# Patient Record
Sex: Male | Born: 1965 | Race: White | Hispanic: No | Marital: Married | Smoking: Former smoker
Health system: Southern US, Community
[De-identification: ages and names within clinical notes are randomized; demographics above are authoritative.]

## PROBLEM LIST (undated history)

## (undated) DIAGNOSIS — Z87442 Personal history of urinary calculi: Secondary | ICD-10-CM

## (undated) DIAGNOSIS — N200 Calculus of kidney: Secondary | ICD-10-CM

## (undated) HISTORY — PX: TONSILLECTOMY: SUR1361

## (undated) HISTORY — PX: OTHER SURGICAL HISTORY: SHX169

---

## 2017-04-03 ENCOUNTER — Emergency Department (HOSPITAL_COMMUNITY)
Admission: EM | Admit: 2017-04-03 | Discharge: 2017-04-03 | Disposition: A | Discharge status: Home / Self Care | Payer: 59 | Attending: Emergency Medicine | Admitting: Emergency Medicine

## 2017-04-03 ENCOUNTER — Emergency Department (HOSPITAL_COMMUNITY): Payer: 59

## 2017-04-03 ENCOUNTER — Encounter (HOSPITAL_COMMUNITY): Payer: Self-pay

## 2017-04-03 DIAGNOSIS — R109 Unspecified abdominal pain: Secondary | ICD-10-CM

## 2017-04-03 DIAGNOSIS — N200 Calculus of kidney: Secondary | ICD-10-CM | POA: Insufficient documentation

## 2017-04-03 DIAGNOSIS — Z87891 Personal history of nicotine dependence: Secondary | ICD-10-CM | POA: Insufficient documentation

## 2017-04-03 DIAGNOSIS — R1032 Left lower quadrant pain: Secondary | ICD-10-CM | POA: Diagnosis present

## 2017-04-03 HISTORY — DX: Calculus of kidney: N20.0

## 2017-04-03 LAB — CBC
HEMATOCRIT: 44.6 % (ref 39.0–52.0)
HEMOGLOBIN: 15.4 g/dL (ref 13.0–17.0)
MCH: 29.4 pg (ref 26.0–34.0)
MCHC: 34.5 g/dL (ref 30.0–36.0)
MCV: 85.1 fL (ref 78.0–100.0)
Platelets: 238 10*3/uL (ref 150–400)
RBC: 5.24 MIL/uL (ref 4.22–5.81)
RDW: 13.3 % (ref 11.5–15.5)
WBC: 7 10*3/uL (ref 4.0–10.5)

## 2017-04-03 LAB — BASIC METABOLIC PANEL
Anion gap: 8 (ref 5–15)
BUN: 18 mg/dL (ref 6–20)
CHLORIDE: 106 mmol/L (ref 101–111)
CO2: 24 mmol/L (ref 22–32)
CREATININE: 1.46 mg/dL — AB (ref 0.61–1.24)
Calcium: 9.3 mg/dL (ref 8.9–10.3)
GFR calc Af Amer: >60 mL/min (ref >60)
GFR calc non Af Amer: 54 mL/min — ABNORMAL LOW (ref >60)
Glucose, Bld: 118 mg/dL — ABNORMAL HIGH (ref 65–99)
POTASSIUM: 3.8 mmol/L (ref 3.5–5.1)
Sodium: 138 mmol/L (ref 135–145)

## 2017-04-03 LAB — URINALYSIS, ROUTINE W REFLEX MICROSCOPIC
Bacteria, UA: NONE SEEN
Bilirubin Urine: NEGATIVE
GLUCOSE, UA: NEGATIVE mg/dL
Ketones, ur: 5 mg/dL — AB
Leukocytes, UA: NEGATIVE
NITRITE: NEGATIVE
PH: 5 (ref 5.0–8.0)
Protein, ur: NEGATIVE mg/dL
SPECIFIC GRAVITY, URINE: 1.026 (ref 1.005–1.030)
Squamous Epithelial / LPF: NONE SEEN

## 2017-04-03 MED ORDER — OXYCODONE-ACETAMINOPHEN 5-325 MG PO TABS
1.0000 | ORAL_TABLET | Freq: Once | ORAL | Status: AC
  Administered 2017-04-03: 1 via ORAL

## 2017-04-03 MED ORDER — ONDANSETRON HCL 4 MG PO TABS: 4.0000 mg | ORAL_TABLET | Freq: Four times a day (QID) | ORAL | 0 refills | Status: AC

## 2017-04-03 MED ORDER — HYDROCODONE-ACETAMINOPHEN 5-325 MG PO TABS: 1.0000 | ORAL_TABLET | ORAL | 0 refills | Status: AC | PRN

## 2017-04-03 MED ORDER — OXYCODONE-ACETAMINOPHEN 5-325 MG PO TABS
ORAL_TABLET | ORAL | Status: AC
  Filled 2017-04-03: qty 1

## 2017-04-03 NOTE — ED Notes (Signed)
Patient transported to CT 

## 2017-04-03 NOTE — ED Provider Notes (Signed)
[facility]ENCY DEPT Provider Note   CSN: [id number] Arrival date & time: 04/03/17  1637     History   Chief Complaint Chief Complaint  Patient presents with  . Flank Pain    HPI Jeffery Powers is a 51 y.o. male.  51 year old male with Hx of kidney stones last 14 years ago has been able to pass 2 needed intervention for the 3rd.  Today developed L flank pain at 1 PM that gradually decreased than returned at 4 PM with nausea  Has not taken any medication for nausea of pain relief      Past Medical History:  Diagnosis Date  . Kidney stones     There are no active problems to display for this patient.   Past Surgical History:  Procedure Laterality Date  . Kidney Stone Removal         Home Medications    Prior to Admission medications   Medication Sig Start Date End Date Taking? Authorizing Provider  HYDROcodone-acetaminophen (NORCO/VICODIN) 5-325 MG tablet Take 1 tablet by mouth every 4 (four) hours as needed. 04/03/17   Earley Favor, NP  ondansetron (ZOFRAN) 4 MG tablet Take 1 tablet (4 mg total) by mouth every 6 (six) hours. 04/03/17   Earley Favor, NP    Family History No family history on file.  Social History Social History  Substance Use Topics  . Smoking status: Former Games developer  . Smokeless tobacco: Never Used  . Alcohol use Not on file     Allergies   Patient has no known allergies.   Review of Systems Review of Systems  Constitutional: Negative for fever.  Gastrointestinal: Positive for nausea. Negative for vomiting.  Genitourinary: Positive for flank pain. Negative for dysuria and hematuria.  All other systems reviewed and are negative.    Physical Exam Updated Vital Signs BP 138/87   Pulse 82   Temp 97.8 F (36.6 C) (Oral)   Resp 16   Ht 5\' 11"  (1.803 m)   Wt 99.8 kg (220 lb)   SpO2 99%   BMI 30.68 kg/m   Physical Exam  Constitutional: He appears well-developed and well-nourished.  HENT:  Head: Normocephalic.  Eyes: Pupils are  equal, round, and reactive to light.  Neck: Normal range of motion.  Cardiovascular: Normal rate.   Pulmonary/Chest: Effort normal.  Musculoskeletal: Normal range of motion.  Neurological: He is alert.  Skin: Skin is warm.  Nursing note and vitals reviewed.    ED Treatments / Results  Labs (all labs ordered are listed, but only abnormal results are displayed) Labs Reviewed  URINALYSIS, ROUTINE W REFLEX MICROSCOPIC - Abnormal; Notable for the following:       Result Value   APPearance HAZY (*)    Hgb urine dipstick LARGE (*)    Ketones, ur 5 (*)    All other components within normal limits  BASIC METABOLIC PANEL - Abnormal; Notable for the following:    Glucose, Bld 118 (*)    Creatinine, Ser 1.46 (*)    GFR calc non Af Amer 54 (*)    All other components within normal limits  CBC    EKG  EKG Interpretation None       Radiology Ct Renal Stone Study  Result Date: 04/03/2017 CLINICAL DATA:  LEFT flank pain beginning this afternoon. History of kidney stones. EXAM: CT ABDOMEN AND PELVIS WITHOUT CONTRAST TECHNIQUE: Multidetector CT imaging of the abdomen and pelvis was performed following the standard protocol without IV contrast. COMPARISON:  None. FINDINGS: LOWER CHEST: Minimal dependent atelectasis. The visualized heart size is normal. No pericardial effusion. HEPATOBILIARY: Diffusely mildly hypodense liver compatible with steatosis. Normal gallbladder. PANCREAS: Normal. SPLEEN: Normal. ADRENALS/URINARY TRACT: Kidneys are orthotopic, demonstrating normal size and morphology. Mild LEFT hydronephrosis, 5 mm proximal ureteral calculus. 4 residual LEFT nephrolithiasis measuring to 4 mm LEFT upper pole. No RIGHT nephrolithiasis or hydronephrosis. Limited assessment for renal masses on this nonenhanced examination. The unopacified ureters are normal in course and caliber. Urinary bladder is partially distended and unremarkable. Normal adrenal glands. STOMACH/BOWEL: The stomach, small  and large bowel are normal in course and caliber without inflammatory changes, sensitivity decreased by lack of enteric contrast. Normal appendix. VASCULAR/LYMPHATIC: Aortoiliac vessels are normal in course and caliber, trace calcific atherosclerosis. No lymphadenopathy by CT size criteria. REPRODUCTIVE: Prostate size is relatively normal, however indents the base the bladder. OTHER: No intraperitoneal free fluid or free air. MUSCULOSKELETAL: Non-acute. Small bilateral fat containing inguinal hernias. Small fat containing umbilical hernia. Scattered old Schmorl's nodes. IMPRESSION: 1. 5 mm proximal LEFT ureteral calculus resulting in mild LEFT obstructive uropathy. 2. 4 LEFT nephrolithiasis measuring to 4 mm. Electronically Signed   By: Awilda Metroourtnay  Bloomer M.D.   On: 04/03/2017 21:27    Procedures Procedures (including critical care time)  Medications Ordered in ED Medications  oxyCODONE-acetaminophen (PERCOCET/ROXICET) 5-325 MG per tablet (not administered)  oxyCODONE-acetaminophen (PERCOCET/ROXICET) 5-325 MG per tablet 1 tablet (1 tablet Oral Given 04/03/17 1650)     Initial Impression / Assessment and Plan / ED Course  I have reviewed the triage vital signs and the nursing notes.  Pertinent labs & imaging results that were available during my care of the patient were reviewed by me and considered in my medical decision making (see chart for details).     Pain free at time of my examination will obtain renal stone study  Patient is a 5 mm stone which should pass on its M he also has 4 more small stones within the renal pole has been discussed with the patient has been given a prescription for Vicodin as well as Zofran that he can use at home for any episodes of pain as well as a referral to urology for follow-up Final Clinical Impressions(s) / ED Diagnoses   Final diagnoses:  Left flank pain  Kidney stone    New Prescriptions New Prescriptions   HYDROCODONE-ACETAMINOPHEN (NORCO/VICODIN)  5-325 MG TABLET    Take 1 tablet by mouth every 4 (four) hours as needed.   ONDANSETRON (ZOFRAN) 4 MG TABLET    Take 1 tablet (4 mg total) by mouth every 6 (six) hours.     Earley FavorSchulz, Keiasha Diep, NP 04/03/17 2227    Doug SouJacubowitz, Sam, MD 04/03/17 [phone]

## 2017-04-03 NOTE — ED Triage Notes (Signed)
Per Pt, Pt is coming from home with complaints of left-sided flank pain that started at 1300. Pt reports having hx of four stones. Denies N/V/D or hematuria.

## 2017-04-03 NOTE — Discharge Instructions (Signed)
You have a 5mm stone that should pass on it own  You have been given a referral to Urology for follow up You have been give prescriptions for pain and nausea control if needed

## 2017-07-30 ENCOUNTER — Other Ambulatory Visit: Payer: Self-pay | Admitting: Urology

## 2017-07-31 ENCOUNTER — Encounter (HOSPITAL_COMMUNITY): Payer: Self-pay | Admitting: *Deleted

## 2017-07-31 ENCOUNTER — Other Ambulatory Visit: Payer: Self-pay

## 2017-08-01 NOTE — H&P (Signed)
 @media  Print    Office Visit Report 07/29/2017    Jeffery HickJeryl Payes         MRN: [id number]  PRIMARY CARE:    DOB: [birth year], 51 year old Male  REFERRING:    SSN: [id number]  PROVIDER:  Rutherford NailEugene Bell, III, M.D.    LOCATION:  [facility] [phone]    CC: I have kidney stones.  HPI: Jeffery Powers is a 51 year-old male patient who is here for renal calculi.  The problem is on the left side. He first stated noticing pain on approximately 07/22/2017. This is not his first kidney stone. He has had more than 5 stones prior to getting this one. He is currently having flank pain and back pain. He denies having groin pain, nausea, vomiting, fever, and chills. He has not caught a stone in his urine strainer since his symptoms began.   He has had ureteral stent and ureteroscopy for treatment of his stones in the past.   The patient has been having severe left-sided flank pain control with hydrocodone. He has a history of nephrolithiasis and has passed several stones in the past. KUB today showed a 6-7 mm left proximal ureteral calculus. He denies fevers, chills, nausea, vomiting. His last stone was in July at which point he had a CT scan revealing multiple left-sided renal calculi as well as a 7 mm proximal ureteral calculus.     ALLERGIES: None   MEDICATIONS: Ibuprofen PRN     GU PSH: Ureteroscopic stone removal    NON-GU PSH: None       GU PMH: Renal calculus    NON-GU PMH: None   FAMILY HISTORY: Death of family member - Father Kidney Stones - Father Prostate Cancer - Father    Notes: 1 son   SOCIAL HISTORY: Marital Status: Married Preferred Language: English; Ethnicity: Not Hispanic Or Latino; Race: White Current Smoking Status: Patient does not smoke anymore. Has not smoked since 07/24/1997. Smoked for 18 years. Smoked 1 pack per day.  <DIV'  Tobacco Use Assessment Completed:  Used Tobacco in last 30 days?   Drinks 1 drink per day.  Drinks 1 caffeinated drink per day.   REVIEW OF SYSTEMS:    GU Review Male:  Patient reports stream starts and stops, frequent urination, and get up at night to urinate. Patient denies leakage of urine, penile pain, trouble starting your stream, erection problems, burning/ pain with urination, have to strain to urinate , and hard to postpone urination.   Gastrointestinal (Upper):  Patient reports nausea, vomiting, and indigestion/ heartburn.    Gastrointestinal (Lower):  Patient denies diarrhea and constipation.   Constitutional:  Patient denies fever, night sweats, weight loss, and fatigue.   Skin:  Patient denies skin rash/ lesion and itching.   Eyes:  Patient denies blurred vision and double vision.   Ears/ Nose/ Throat:  Patient denies sore throat and sinus problems.   Hematologic/Lymphatic:  Patient denies swollen glands and easy bruising.   Cardiovascular:  Patient denies leg swelling and chest pains.   Respiratory:  Patient reports cough. Patient denies shortness of breath.   Endocrine:  Patient denies excessive thirst.   Musculoskeletal:  Patient reports back pain. Patient denies joint pain.   Neurological:  Patient reports headaches. Patient denies dizziness.   Psychologic:  Patient denies depression and anxiety.   VITAL SIGNS:      07/29/2017 03:27 PM    Weight 220 lb / 99.79 kg  Height 71 in / 180.34 cm    BP 129/82 mmHg    Pulse 89 /min    Temperature 97.5 F / 36.3 C    BMI 30.7 kg/m    MULTI-SYSTEM PHYSICAL EXAMINATION:     Constitutional: Well-nourished. No physical deformities. Normally developed. Good grooming.    Respiratory: No labored breathing, no use of accessory muscles.     Cardiovascular: Normal temperature, adequate perfusion    Skin: No paleness, no jaundice    Neurologic / Psychiatric: Oriented to time, oriented to place, oriented to person. No depression, no anxiety, no agitation.    Gastrointestinal: No mass, no tenderness, no rigidity, non obese abdomen. No CVA tenderness bilaterally      Eyes: Normal conjunctivae. Normal eyelids.    Musculoskeletal: Normal gait and station of head and neck.          PAST DATA REVIEWED:   Source Of History:  Patient   PROCEDURES:    KUB - [id number]  A single view of the abdomen is obtained.          Urinalysis  Dipstick Dipstick Cont'd  Color: Yellow Bilirubin: Neg  Appearance: Clear Ketones: Neg  Specific Gravity: 1.025 Blood: Neg  pH: 6.0 Protein: Trace  Glucose: Neg Urobilinogen: 0.2   Nitrites: Neg   Leukocyte Esterase: Neg    ASSESSMENT:     ICD-10 Details  1 GU:  Renal calculus - N20.0    PLAN:   Medications  New Meds: Tamsulosin Hcl 0.4 mg capsule, ext release 24 hr 1 capsule PO Daily To assist with stone passage #14 1 Refill(s)  Hydrocodone-Acetaminophen 5 mg-325 mg tablet 1 tablet PO Q 4 H PRN For pain #15 0 Refill(s)    Orders  X-Rays: KUB  Schedule    Document  Letter(s):  Created for Patient: Clinical Summary   Notes:  We discussed the management of urinary stones. These options include observation, ureteroscopy, and shockwave lithotripsy. We discussed which options are relevant to these particular stones. We discussed the natural history of stones as well as the complications of untreated stones and the impact on quality of life without treatment as well as with each of the above listed treatments. We also discussed the efficacy of each treatment in its ability to clear the stone burden. With any of these management options I discussed the signs and symptoms of infection and the need for emergent treatment should these be experienced. For each option we discussed the ability of each procedure to clear the patient of their stone burden.   For observation I described the risks which include but are not limited to silent renal damage, life-threatening infection, need for emergent surgery, failure to pass stone, and pain.   For ureteroscopy I described the risks which include heart attack, stroke, pulmonary  embolus, death, bleeding, infection, damage to contiguous structures, positioning injury, ureteral stricture, ureteral avulsion, ureteral injury, need for ureteral stent, inability to perform ureteroscopy, need for an interval procedure, inability to clear stone burden, stent discomfort and pain.   For shockwave lithotripsy I described the risks which include arrhythmia, kidney contusion, kidney hemorrhage, need for transfusion, pain, inability to break up stone, inability to pass stone fragments, Steinstrasse, infection associated with obstructing stones, need for different surgical procedure, need for repeat shockwave lithotripsy, and death.   He would like to proceed with left ESWL. I gave him a prescription for hydrocodone today and also give him a strainer to strain his urine as well as a prescription  for Flomax. If he passes the stone in the meantime he will call us.   * Signed by Modena Slater, III, M.D. on 07/29/17 at 4:40 PM (EST)*

## 2017-08-04 ENCOUNTER — Encounter (HOSPITAL_COMMUNITY): Payer: Self-pay | Admitting: *Deleted

## 2017-08-04 ENCOUNTER — Other Ambulatory Visit: Payer: Self-pay

## 2017-08-04 ENCOUNTER — Encounter (HOSPITAL_COMMUNITY)
Admission: RE | Disposition: A | Discharge status: Home / Self Care | Payer: Self-pay | Source: Ambulatory Visit | Attending: Urology

## 2017-08-04 ENCOUNTER — Ambulatory Visit (HOSPITAL_COMMUNITY): Payer: 59

## 2017-08-04 ENCOUNTER — Ambulatory Visit (HOSPITAL_COMMUNITY)
Admission: RE | Admit: 2017-08-04 | Discharge: 2017-08-04 | Disposition: A | Discharge status: Home / Self Care | Payer: 59 | Source: Ambulatory Visit | Attending: Urology | Admitting: Urology

## 2017-08-04 DIAGNOSIS — Z87442 Personal history of urinary calculi: Secondary | ICD-10-CM | POA: Insufficient documentation

## 2017-08-04 DIAGNOSIS — N201 Calculus of ureter: Secondary | ICD-10-CM

## 2017-08-04 DIAGNOSIS — N202 Calculus of kidney with calculus of ureter: Secondary | ICD-10-CM | POA: Insufficient documentation

## 2017-08-04 DIAGNOSIS — Z87891 Personal history of nicotine dependence: Secondary | ICD-10-CM | POA: Diagnosis not present

## 2017-08-04 HISTORY — PX: EXTRACORPOREAL SHOCK WAVE LITHOTRIPSY: SHX1557

## 2017-08-04 HISTORY — DX: Personal history of urinary calculi: Z87.442

## 2017-08-04 SURGERY — LITHOTRIPSY, ESWL
Anesthesia: LOCAL | Laterality: Left

## 2017-08-04 MED ORDER — HYDROCODONE-ACETAMINOPHEN 5-325 MG PO TABS
1.0000 | ORAL_TABLET | Freq: Once | ORAL | Status: AC
  Administered 2017-08-04: 1 via ORAL
  Filled 2017-08-04: qty 1

## 2017-08-04 MED ORDER — CIPROFLOXACIN HCL 500 MG PO TABS
500.0000 mg | ORAL_TABLET | ORAL | Status: AC
  Administered 2017-08-04: 500 mg via ORAL
  Filled 2017-08-04: qty 1

## 2017-08-04 MED ORDER — DIAZEPAM 5 MG PO TABS
10.0000 mg | ORAL_TABLET | ORAL | Status: AC
  Administered 2017-08-04: 10 mg via ORAL
  Filled 2017-08-04: qty 2

## 2017-08-04 MED ORDER — DIPHENHYDRAMINE HCL 25 MG PO CAPS
25.0000 mg | ORAL_CAPSULE | ORAL | Status: AC
  Administered 2017-08-04: 25 mg via ORAL
  Filled 2017-08-04: qty 1

## 2017-08-04 MED ORDER — SODIUM CHLORIDE 0.9 % IV SOLN
INTRAVENOUS | Status: DC
  Administered 2017-08-04: 07:00:00 via INTRAVENOUS

## 2017-08-04 NOTE — Discharge Instructions (Signed)
Moderate Conscious Sedation, Adult, Care After °These instructions provide you with information about caring for yourself after your procedure. Your health care provider may also give you more specific instructions. Your treatment has been planned according to current medical practices, but problems sometimes occur. Call your health care provider if you have any problems or questions after your procedure. °What can I expect after the procedure? °After your procedure, it is common: °· To feel sleepy for several hours. °· To feel clumsy and have poor balance for several hours. °· To have poor judgment for several hours. °· To vomit if you eat too soon. ° °Follow these instructions at home: °For at least 24 hours after the procedure: ° °· Do not: °? Participate in activities where you could fall or become injured. °? Drive. °? Use heavy machinery. °? Drink alcohol. °? Take sleeping pills or medicines that cause drowsiness. °? Make important decisions or sign legal documents. °? Take care of children on your own. °· Rest. °Eating and drinking °· Follow the diet recommended by your health care provider. °· If you vomit: °? Drink water, juice, or soup when you can drink without vomiting. °? Make sure you have little or no nausea before eating solid foods. °General instructions °· Have a responsible adult stay with you until you are awake and alert. °· Take over-the-counter and prescription medicines only as told by your health care provider. °· If you smoke, do not smoke without supervision. °· Keep all follow-up visits as told by your health care provider. This is important. °Contact a health care provider if: °· You keep feeling nauseous or you keep vomiting. °· You feel light-headed. °· You develop a rash. °· You have a fever. °Get help right away if: °· You have trouble breathing. °This information is not intended to replace advice given to you by your health care provider. Make sure you discuss any questions you have  with your health care provider. °Document Released: 06/30/2013 Document Revised: 02/12/2016 Document Reviewed: 12/30/2015 °Elsevier Interactive Patient Education © 2018 [employer] °Lithotripsy, Care After °This sheet gives you information about how to care for yourself after your procedure. Your health care provider may also give you more specific instructions. If you have problems or questions, contact your health care provider. °What can I expect after the procedure? °After the procedure, it is common to have: °· Some blood in your urine. This should only last for a few days. °· Soreness in your back, sides, or upper abdomen for a few days. °· Blotches or bruises on your back where the pressure wave entered the skin. °· Pain, discomfort, or nausea when pieces (fragments) of the kidney stone move through the tube that carries urine from the kidney to the bladder (ureter). Stone fragments may pass soon after the procedure, but they may continue to pass for up to 4-8 weeks. °? If you have severe pain or nausea, contact your health care provider. This may be caused by a large stone that was not broken up, and this may mean that you need more treatment. °· Some pain or discomfort during urination. °· Some pain or discomfort in the lower abdomen or (in men) at the base of the penis. ° °Follow these instructions at home: °Medicines °· Take over-the-counter and prescription medicines only as told by your health care provider. °· If you were prescribed an antibiotic medicine, take it as told by your health care provider. Do not stop taking the antibiotic even if you   start to feel better.  Do not drive for 24 hours if you were given a medicine to help you relax (sedative).  Do not drive or use heavy machinery while taking prescription pain medicine. Eating and drinking  Drink enough water and fluids to keep your urine clear or pale yellow. This helps any remaining pieces of the stone to pass. It can also help  prevent new stones from forming.  Eat plenty of fresh fruits and vegetables.  Follow instructions from your health care provider about eating and drinking restrictions. You may be instructed: ? To reduce how much salt (sodium) you eat or drink. Check ingredients and nutrition facts on packaged foods and beverages. ? To reduce how much meat you eat.  Eat the recommended amount of calcium for your age and gender. Ask your health care provider how much calcium you should have. General instructions  Get plenty of rest.  Most people can resume normal activities 1-2 days after the procedure. Ask your health care provider what activities are safe for you.  If directed, strain all urine through the strainer that was provided by your health care provider. ? Keep all fragments for your health care provider to see. Any stones that are found may be sent to a medical lab for examination. The stone may be as small as a grain of salt.  Keep all follow-up visits as told by your health care provider. This is important. Contact a health care provider if:  You have pain that is severe or does not get better with medicine.  You have nausea that is severe or does not go away.  You have blood in your urine longer than your health care provider told you to expect.  You have more blood in your urine.  You have pain during urination that does not go away.  You urinate more frequently than usual and this does not go away.  You develop a rash or any other possible signs of an allergic reaction. Get help right away if:  You have severe pain in your back, sides, or upper abdomen.  You have severe pain while urinating.  Your urine is very dark red.  You have blood in your stool (feces).  You cannot pass any urine at all.  You feel a strong urge to urinate after emptying your bladder.  You have a fever or chills.  You develop shortness of breath, difficulty breathing, or chest pain.  You have  severe nausea that leads to persistent vomiting.  You faint. Summary  After this procedure, it is common to have some pain, discomfort, or nausea when pieces (fragments) of the kidney stone move through the tube that carries urine from the kidney to the bladder (ureter). If this pain or nausea is severe, however, you should contact your health care provider.  Most people can resume normal activities 1-2 days after the procedure. Ask your health care provider what activities are safe for you.  Drink enough water and fluids to keep your urine clear or pale yellow. This helps any remaining pieces of the stone to pass, and it can help prevent new stones from forming.  If directed, strain your urine and keep all fragments for your health care provider to see. Fragments or stones may be as small as a grain of salt.  Get help right away if you have severe pain in your back, sides, or upper abdomen or have severe pain while urinating. This information is not intended to replace advice  given to you by your health care provider. Make sure you discuss any questions you have with your health care provider. Document Released: 09/29/2007 Document Revised: 07/31/2016 Document Reviewed: 07/31/2016 Elsevier Interactive Patient Education  2017 Elsevier Inc.    Next Prescription Pain Medication can be given at approx 1:30 pm  1. You should strain your urine and collect all fragments and bring them to your follow up appointment.  2. You should take your pain medication as needed.  Please call if your pain is severe to the point that it is not controlled with your pain medication. 3. You should call if you develop fever > 101 or persistent nausea or vomiting. 4. Your doctor may prescribe tamsulosin to take to help facilitate stone passage.

## 2017-08-04 NOTE — Interval H&P Note (Signed)
History and Physical Interval Note:  08/04/2017 7:30 AM  Jeffery Powers  has presented today for surgery, with the diagnosis of LEFT URETERAL STONE  The various methods of treatment have been discussed with the patient and family. After consideration of risks, benefits and other options for treatment, the patient has consented to  Procedure(s): LEFT EXTRACORPOREAL SHOCK WAVE LITHOTRIPSY (ESWL) (Left) as a surgical intervention .  The patient's history has been reviewed, patient examined, no change in status, stable for surgery.  I have reviewed the patient's chart and labs.  Questions were answered to the patient's satisfaction.     Arslan Kier,LES

## 2017-08-04 NOTE — Op Note (Signed)
Please see scanned chart for operative ESWL note.

## 2018-07-11 IMAGING — CT CT RENAL STONE PROTOCOL
2 of 4 series · 16 of 46 positions shown, 18 images · non-contrast
Comparison: None.

CLINICAL DATA: LEFT flank pain beginning this afternoon. History of
kidney stones.

EXAM:
CT ABDOMEN AND PELVIS WITHOUT CONTRAST
TECHNIQUE: Multidetector CT imaging of the abdomen and pelvis was performed
following the standard protocol without IV contrast.

[Series 3: stone study 5.0 i30f 2 · axial · 0.82mm/px · z∈[+759,+1219]mm · 13 of 100 slices shown, 15 images]
[im 4/100  soft-tissue]
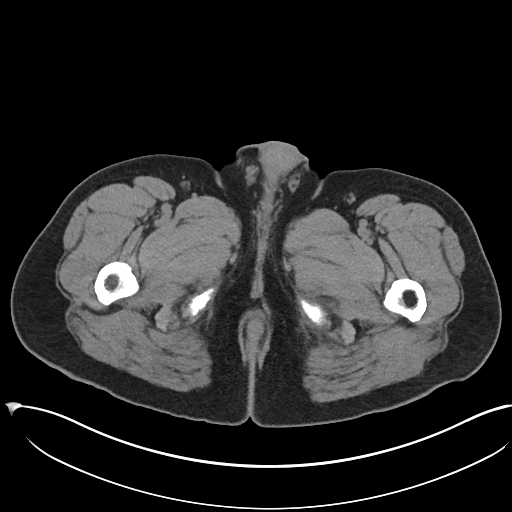
[im 4/100  bone]
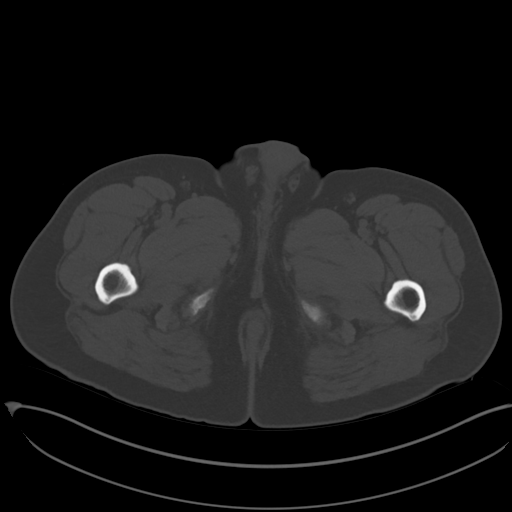
[im 12/100  soft-tissue]
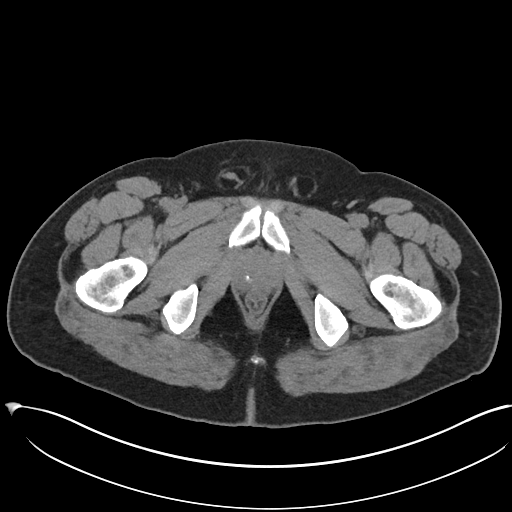
[im 20/100  soft-tissue]
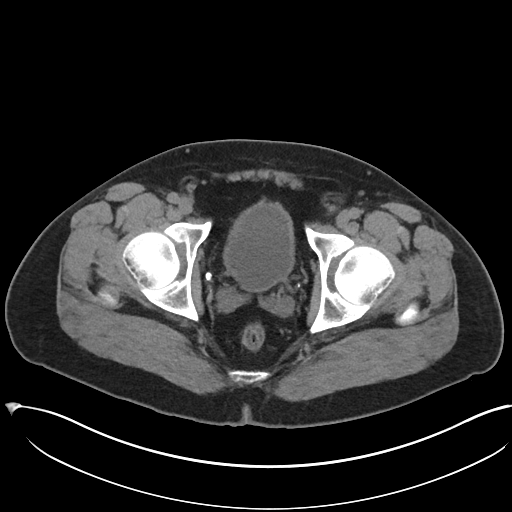
[im 28/100  soft-tissue]
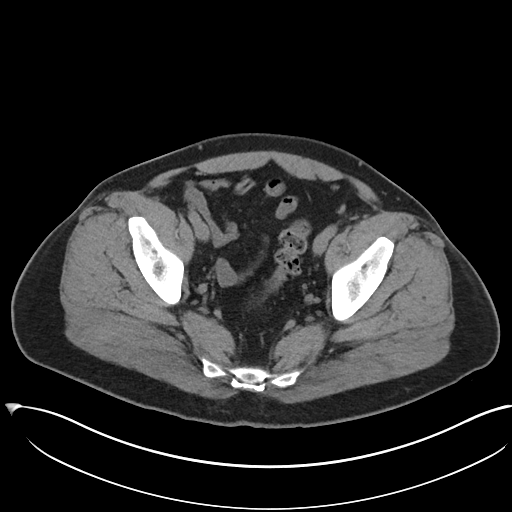
[im 36/100  soft-tissue]
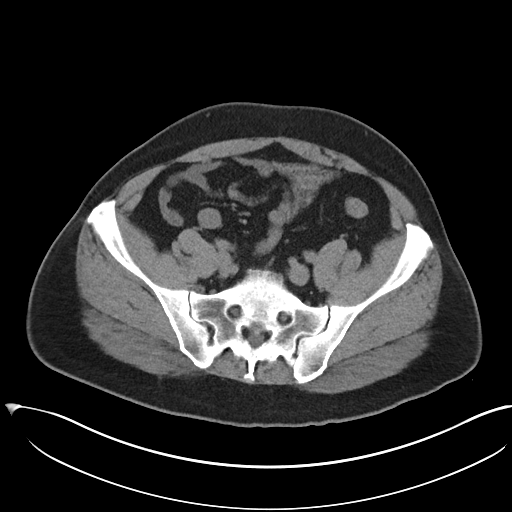
[im 44/100  soft-tissue]
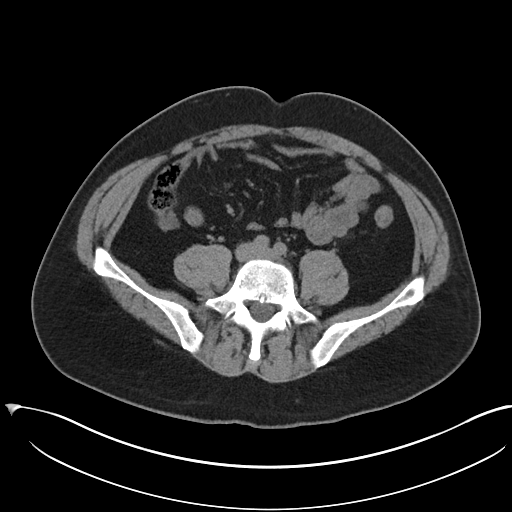
[im 52/100  soft-tissue]
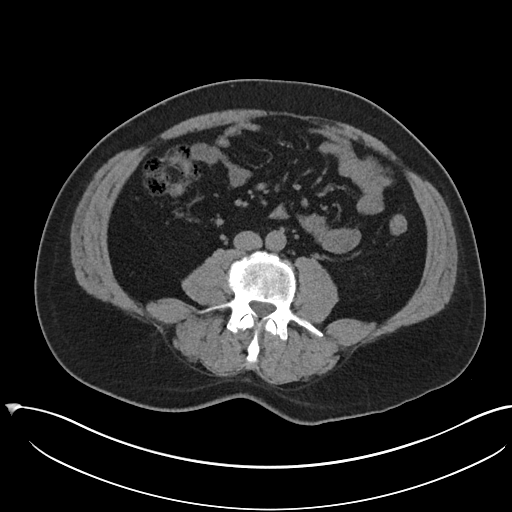
[im 56/100  soft-tissue]
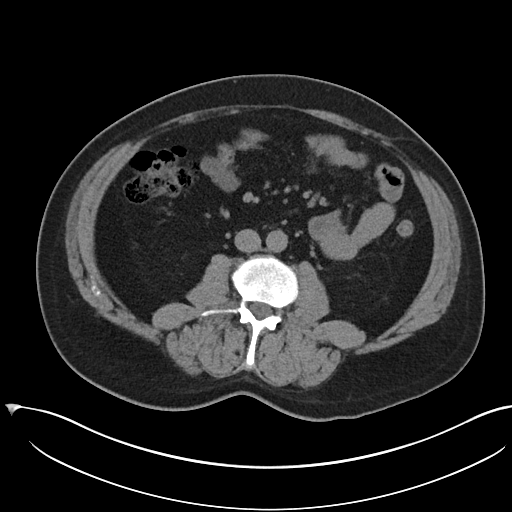
[im 64/100  soft-tissue]
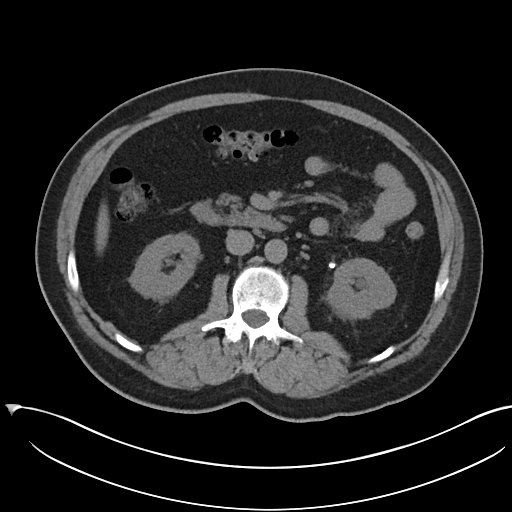
[im 64/100  bone]
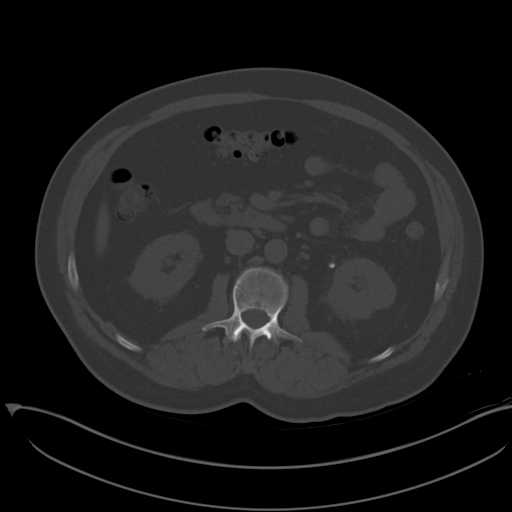
[im 72/100  soft-tissue]
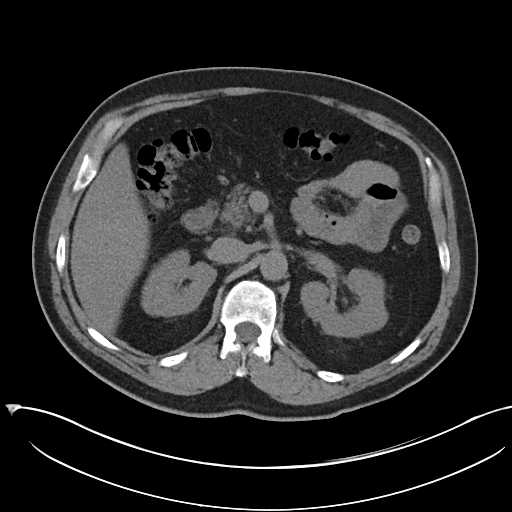
[im 80/100  soft-tissue]
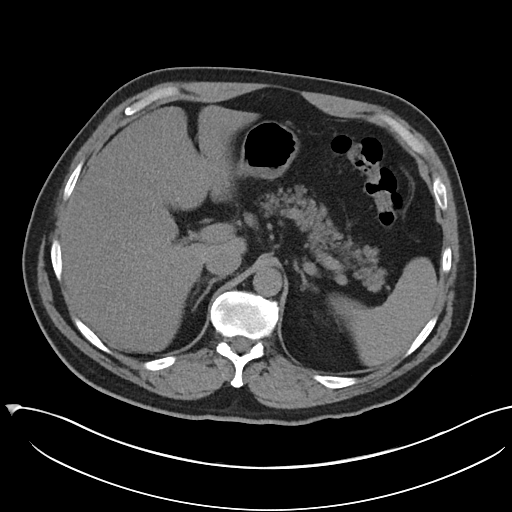
[im 88/100  soft-tissue]
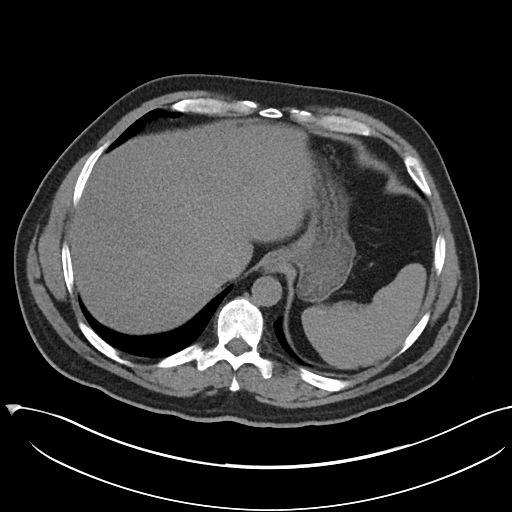
[im 96/100  soft-tissue]
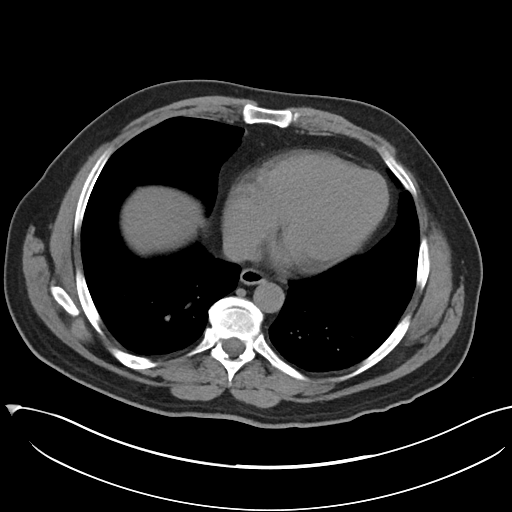

[Series 6: coronal soft tissue · coronal · 0.83mm/px · 3 of 106 slices shown]
[im 36/106  soft-tissue]
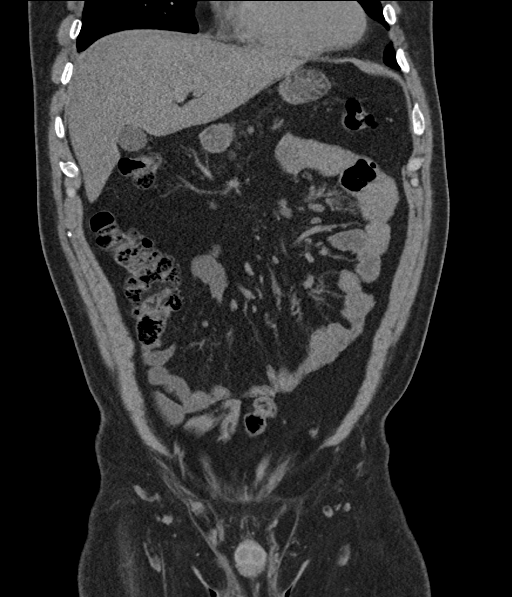
[im 47/106  soft-tissue]
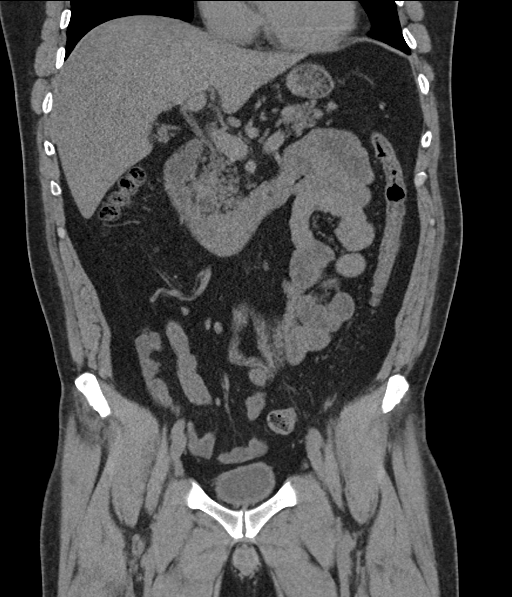
[im 59/106  soft-tissue]
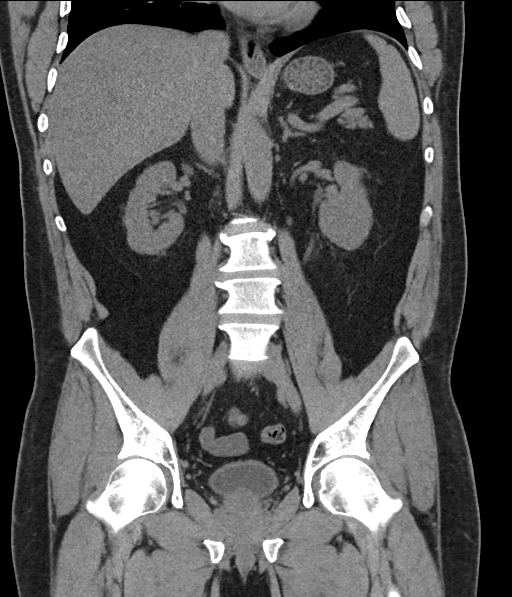

[16 of 46 positions shown; findings below may reference images not displayed]

FINDINGS: LOWER CHEST: Minimal dependent atelectasis. The visualized heart
size is normal. No pericardial effusion.

HEPATOBILIARY: Diffusely mildly hypodense liver compatible with
steatosis. Normal gallbladder.

PANCREAS: Normal.

SPLEEN: Normal.

ADRENALS/URINARY TRACT: Kidneys are orthotopic, demonstrating normal
size and morphology. Mild LEFT hydronephrosis, 5 mm proximal
ureteral calculus. 4 residual LEFT nephrolithiasis measuring to 4 mm
LEFT upper pole. No RIGHT nephrolithiasis or hydronephrosis. Limited
assessment for renal masses on this nonenhanced examination. The
unopacified ureters are normal in course and caliber. Urinary
bladder is partially distended and unremarkable. Normal adrenal
glands.

STOMACH/BOWEL: The stomach, small and large bowel are normal in
course and caliber without inflammatory changes, sensitivity
decreased by lack of enteric contrast. Normal appendix.

VASCULAR/LYMPHATIC: Aortoiliac vessels are normal in course and
caliber, trace calcific atherosclerosis. No lymphadenopathy by CT
size criteria.

REPRODUCTIVE: Prostate size is relatively normal, however indents
the base the bladder.

OTHER: No intraperitoneal free fluid or free air.

MUSCULOSKELETAL: Non-acute. Small bilateral fat containing inguinal
hernias. Small fat containing umbilical hernia. Scattered old
Schmorl's nodes.
IMPRESSION: 1. 5 mm proximal LEFT ureteral calculus resulting in mild LEFT
obstructive uropathy.
2. 4 LEFT nephrolithiasis measuring to 4 mm.

## 2022-07-24 ENCOUNTER — Ambulatory Visit
Admission: EM | Admit: 2022-07-24 | Discharge: 2022-07-24 | Disposition: A | Discharge status: Home / Self Care | Payer: BC Managed Care – PPO | Attending: Urgent Care | Admitting: Urgent Care

## 2022-07-24 DIAGNOSIS — L0233 Carbuncle of buttock: Secondary | ICD-10-CM

## 2022-07-24 MED ORDER — SULFAMETHOXAZOLE-TRIMETHOPRIM 800-160 MG PO TABS: 1.0000 | ORAL_TABLET | Freq: Two times a day (BID) | ORAL | 0 refills | Status: AC

## 2022-07-24 NOTE — ED Triage Notes (Signed)
Pt. Presents to UC w/ c/o bumps on his right buttock that appeared a week ago. Pt. States the bumps are painful and produce clear drainage.

## 2022-07-24 NOTE — ED Provider Notes (Signed)
[facility]    CSN: [id number] Arrival date & time: 07/24/22  1053      History   Chief Complaint Chief Complaint  Patient presents with   Rash    HPI Jeffery Powers is a 56 y.o. male.    Rash   This to UC with complaint of "bumps" on his right buttock appearing 1 week ago.  He states the lesions are painful and producing clear drainage.  Past Medical History:  Diagnosis Date   History of kidney stones    Kidney stones     There are no problems to display for this patient.   Past Surgical History:  Procedure Laterality Date   EXTRACORPOREAL SHOCK WAVE LITHOTRIPSY Left 08/04/2017   Procedure: LEFT EXTRACORPOREAL SHOCK WAVE LITHOTRIPSY (ESWL);  Surgeon: Raynelle Bring, MD;  Location: [facility];  Service: Urology;  Laterality: Left;   Kidney Stone Removal     TONSILLECTOMY         Home Medications    Prior to Admission medications   Medication Sig Start Date End Date Taking? Authorizing Provider  calcium carbonate (TUMS - DOSED IN MG ELEMENTAL CALCIUM) 500 MG chewable tablet Chew 2 tablets daily by mouth.    [provider]  HYDROcodone-acetaminophen (NORCO/VICODIN) 5-325 MG tablet Take 1 tablet by mouth every 4 (four) hours as needed. 04/03/17   Junius Creamer, NP  ondansetron (ZOFRAN) 4 MG tablet Take 1 tablet (4 mg total) by mouth every 6 (six) hours. 04/03/17   Junius Creamer, NP  Pseudoephedrine-Ibuprofen 30-200 MG TABS Take 1 tablet every 8 (eight) hours as needed by mouth.    [provider]    Family History No family history on file.  Social History Social History   Tobacco Use   Smoking status: Former    Packs/day: 0.25    Years: 18.00    Total pack years: 4.50    Types: Cigarettes    Quit date: 09/23/2006    Years since quitting: 15.8   Smokeless tobacco: Never  Vaping Use   Vaping Use: Never used  Substance Use Topics   Alcohol use: Yes    Comment: has not had any alcohol in approx 3 wks   Drug use: No      Allergies   Patient has no known allergies.   Review of Systems Review of Systems  Skin:  Positive for rash.     Physical Exam Triage Vital Signs ED Triage Vitals  Enc Vitals Group     BP      Pulse      Resp      Temp      Temp src      SpO2      Weight      Height      Head Circumference      Peak Flow      Pain Score      Pain Loc      Pain Edu?      Excl. in [place]?    No data found.  Updated Vital Signs There were no vitals taken for this visit.  Visual Acuity Right Eye Distance:   Left Eye Distance:   Bilateral Distance:    Right Eye Near:   Left Eye Near:    Bilateral Near:     Physical Exam Constitutional:      Appearance: Normal appearance.  Musculoskeletal:       Legs:  Neurological:     Mental Status: He is alert.  UC Treatments / Results  Labs (all labs ordered are listed, but only abnormal results are displayed) Labs Reviewed - No data to display  EKG   Radiology No results found.  Procedures Procedures (including critical care time)  Medications Ordered in UC Medications - No data to display  Initial Impression / Assessment and Plan / UC Course  I have reviewed the triage vital signs and the nursing notes.  Pertinent labs & imaging results that were available during my care of the patient were reviewed by me and considered in my medical decision making (see chart for details).   Cellulitic likely abscesses.  Not fluctuant and no clear pocket of purulence to drain via I&D.  Will treat with antibiotics and recommend patient perform warm water soaks with dissolved Epsom salts twice daily.  Final Clinical Impressions(s) / UC Diagnoses   Final diagnoses:  None   Discharge Instructions   None    ED Prescriptions   None    PDMP not reviewed this encounter.   Charma Igo, [place] 07/24/22 1124

## 2022-07-24 NOTE — Discharge Instructions (Addendum)
Follow up here or with your primary care provider if your symptoms are worsening or not improving with treatment.     

## 2022-09-28 ENCOUNTER — Ambulatory Visit
Admission: EM | Admit: 2022-09-28 | Discharge: 2022-09-28 | Disposition: A | Discharge status: Home / Self Care | Payer: BC Managed Care – PPO | Attending: Urgent Care | Admitting: Urgent Care

## 2022-09-28 DIAGNOSIS — S70362A Insect bite (nonvenomous), left thigh, initial encounter: Secondary | ICD-10-CM

## 2022-09-28 DIAGNOSIS — L03116 Cellulitis of left lower limb: Secondary | ICD-10-CM

## 2022-09-28 DIAGNOSIS — T63481A Toxic effect of venom of other arthropod, accidental (unintentional), initial encounter: Secondary | ICD-10-CM

## 2022-09-28 DIAGNOSIS — W57XXXA Bitten or stung by nonvenomous insect and other nonvenomous arthropods, initial encounter: Secondary | ICD-10-CM | POA: Diagnosis not present

## 2022-09-28 MED ORDER — DEXAMETHASONE SODIUM PHOSPHATE 10 MG/ML IJ SOLN
10.0000 mg | Freq: Once | INTRAMUSCULAR | Status: AC
  Administered 2022-09-28: 10 mg via INTRAMUSCULAR

## 2022-09-28 MED ORDER — PREDNISONE 10 MG PO TABS: ORAL_TABLET | ORAL | 0 refills | Status: AC

## 2022-09-28 MED ORDER — CEPHALEXIN 500 MG PO CAPS: 500.0000 mg | ORAL_CAPSULE | Freq: Four times a day (QID) | ORAL | 0 refills | Status: AC

## 2022-09-28 NOTE — Discharge Instructions (Addendum)
Follow up here or with your primary care provider if your symptoms are worsening or not improving with treatment.     

## 2022-09-28 NOTE — ED Triage Notes (Signed)
Pt. Presents to UC w/ c/o a possible spider bite to the inside of his left thigh. Pt. States the area is tender and hot to the touch. Pt. Also endorses the site is spreading in size.

## 2022-09-28 NOTE — ED Provider Notes (Signed)
Jeffery Powers    CSN: [id number] Arrival date & time: 09/28/22  1358      History   Chief Complaint Chief Complaint  Patient presents with   Insect Bite    HPI Jeffery Powers is a 56 y.o. male.   HPI  Patient presents to urgent care with complaint of a possible insect bite which she presumes is a spider on the inside of his left thigh.  He endorses area is tender and hot to the touch.  Also states that is spreading in size.  Past Medical History:  Diagnosis Date   History of kidney stones    Kidney stones     There are no problems to display for this patient.   Past Surgical History:  Procedure Laterality Date   EXTRACORPOREAL SHOCK WAVE LITHOTRIPSY Left 08/04/2017   Procedure: LEFT EXTRACORPOREAL SHOCK WAVE LITHOTRIPSY (ESWL);  Surgeon: Heloise Purpura, MD;  Location: [facility];  Service: Urology;  Laterality: Left;   Kidney Stone Removal     TONSILLECTOMY         Home Medications    Prior to Admission medications   Medication Sig Start Date End Date Taking? Authorizing Provider  calcium carbonate (TUMS - DOSED IN MG ELEMENTAL CALCIUM) 500 MG chewable tablet Chew 2 tablets daily by mouth.    [provider]  HYDROcodone-acetaminophen (NORCO/VICODIN) 5-325 MG tablet Take 1 tablet by mouth every 4 (four) hours as needed. 04/03/17   Earley Favor, NP  ondansetron (ZOFRAN) 4 MG tablet Take 1 tablet (4 mg total) by mouth every 6 (six) hours. 04/03/17   Earley Favor, NP  Pseudoephedrine-Ibuprofen 30-200 MG TABS Take 1 tablet every 8 (eight) hours as needed by mouth.    [provider]    Family History History reviewed. No pertinent family history.  Social History Social History   Tobacco Use   Smoking status: Former    Packs/day: 0.25    Years: 18.00    Total pack years: 4.50    Types: Cigarettes    Quit date: 09/23/2006    Years since quitting: 16.0   Smokeless tobacco: Never  Vaping Use   Vaping Use: Never used  Substance Use Topics    Alcohol use: Yes    Comment: has not had any alcohol in approx 3 wks   Drug use: No     Allergies   Patient has no known allergies.   Review of Systems Review of Systems   Physical Exam Triage Vital Signs ED Triage Vitals  Enc Vitals Group     BP 09/28/22 1446 137/88     Pulse Rate 09/28/22 1446 86     Resp 09/28/22 1446 18     Temp 09/28/22 1446 98.1 F (36.7 C)     Temp src --      SpO2 09/28/22 1446 95 %     Weight --      Height --      Head Circumference --      Peak Flow --      Pain Score 09/28/22 1447 5     Pain Loc --      Pain Edu? --      Excl. in [place]? --    No data found.  Updated Vital Signs BP 137/88   Pulse 86   Temp 98.1 F (36.7 C)   Resp 18   SpO2 95%   Visual Acuity Right Eye Distance:   Left Eye Distance:   Bilateral Distance:  Right Eye Near:   Left Eye Near:    Bilateral Near:     Physical Exam Vitals reviewed.  Constitutional:      Appearance: Normal appearance.  Musculoskeletal:       Legs:  Skin:    General: Skin is warm and dry.  Neurological:     General: No focal deficit present.     Mental Status: He is alert and oriented to person, place, and time.  Psychiatric:        Mood and Affect: Mood normal.        Behavior: Behavior normal.      UC Treatments / Results  Labs (all labs ordered are listed, but only abnormal results are displayed) Labs Reviewed - No data to display  EKG   Radiology No results found.  Procedures Procedures (including critical care time)  Medications Ordered in UC Medications - No data to display  Initial Impression / Assessment and Plan / UC Course  I have reviewed the triage vital signs and the nursing notes.  Pertinent labs & imaging results that were available during my care of the patient were reviewed by me and considered in my medical decision making (see chart for details).   Local inflammatory reaction versus cellulitis from presumed insect bite.  Will treat for  cellulitis as well as give him a course of prednisone to reverse any inflammatory reaction that might be occurring.  Directed patient to use a sharpie to mark the outside border of the wider area of erythema and note any significant spread outside the marked area.  Advised him that he should see a receding area of induration and fading erythema while being treated.   Final Clinical Impressions(s) / UC Diagnoses   Final diagnoses:  None   Discharge Instructions   None    ED Prescriptions   None    PDMP not reviewed this encounter.   Rose Phi, [place] 09/28/22 1505

## 2023-01-01 ENCOUNTER — Other Ambulatory Visit: Payer: Self-pay

## 2023-01-01 ENCOUNTER — Ambulatory Visit
Admission: EM | Admit: 2023-01-01 | Discharge: 2023-01-01 | Disposition: A | Discharge status: Home / Self Care | Payer: BC Managed Care – PPO | Attending: Emergency Medicine | Admitting: Emergency Medicine

## 2023-01-01 DIAGNOSIS — L989 Disorder of the skin and subcutaneous tissue, unspecified: Secondary | ICD-10-CM | POA: Diagnosis not present

## 2023-01-01 MED ORDER — MUPIROCIN 2 % EX OINT: 1.0000 | TOPICAL_OINTMENT | Freq: Two times a day (BID) | CUTANEOUS | 0 refills | Status: AC

## 2023-01-01 NOTE — ED Provider Notes (Signed)
Jeffery Powers    CSN: [id number] Arrival date & time: 01/01/23  0840      History   Chief Complaint Chief Complaint  Patient presents with   Cellulitis    HPI Jeffery Powers is a 57 y.o. male.  Patient presents with a tender red insect bite on his right lower thigh x 4 days.  No treatment attempted at home.  He has some redness surrounding the lesion.  No fever, chills, drainage, or other symptoms.  The history is provided by the patient and medical records.    Past Medical History:  Diagnosis Date   History of kidney stones    Kidney stones     There are no problems to display for this patient.   Past Surgical History:  Procedure Laterality Date   EXTRACORPOREAL SHOCK WAVE LITHOTRIPSY Left 08/04/2017   Procedure: LEFT EXTRACORPOREAL SHOCK WAVE LITHOTRIPSY (ESWL);  Surgeon: Heloise Purpura, MD;  Location: [facility];  Service: Urology;  Laterality: Left;   Kidney Stone Removal     TONSILLECTOMY         Home Medications    Prior to Admission medications   Medication Sig Start Date End Date Taking? Authorizing Provider  mupirocin ointment (BACTROBAN) 2 % Apply 1 Application topically 2 (two) times daily. 01/01/23  Yes Mickie Bail, NP  calcium carbonate (TUMS - DOSED IN MG ELEMENTAL CALCIUM) 500 MG chewable tablet Chew 2 tablets daily by mouth.    [provider]  HYDROcodone-acetaminophen (NORCO/VICODIN) 5-325 MG tablet Take 1 tablet by mouth every 4 (four) hours as needed. 04/03/17   Earley Favor, NP  ondansetron (ZOFRAN) 4 MG tablet Take 1 tablet (4 mg total) by mouth every 6 (six) hours. 04/03/17   Earley Favor, NP  predniSONE (DELTASONE) 10 MG tablet Take 4 tablets (40 mg) for four days, then 2 tablets (20 mg) for four days, then 1 tablet (10 mg) for four days. 09/28/22   Immordino, Jeannett Senior, FNP  Pseudoephedrine-Ibuprofen 30-200 MG TABS Take 1 tablet every 8 (eight) hours as needed by mouth.    [provider]    Family History No family history  on file.  Social History Social History   Tobacco Use   Smoking status: Former    Packs/day: 0.25    Years: 18.00    Additional pack years: 0.00    Total pack years: 4.50    Types: Cigarettes    Quit date: 09/23/2006    Years since quitting: 16.2   Smokeless tobacco: Never  Vaping Use   Vaping Use: Never used  Substance Use Topics   Alcohol use: Yes    Comment: has not had any alcohol in approx 3 wks   Drug use: No     Allergies   Patient has no known allergies.   Review of Systems Review of Systems  Constitutional:  Negative for chills and fever.  Musculoskeletal:  Negative for arthralgias, gait problem and joint swelling.  Skin:  Positive for color change and wound.  Neurological:  Negative for weakness and numbness.  All other systems reviewed and are negative.    Physical Exam Triage Vital Signs ED Triage Vitals  Enc Vitals Group     BP --      Pulse Rate 01/01/23 0853 83     Resp 01/01/23 0853 18     Temp 01/01/23 0853 97.6 F (36.4 C)     Temp src --      SpO2 01/01/23 0853 95 %  Weight --      Height --      Head Circumference --      Peak Flow --      Pain Score 01/01/23 0856 5     Pain Loc --      Pain Edu? --      Excl. in [place]? --    No data found.  Updated Vital Signs BP 116/67 (BP Location: Right Arm)   Pulse 83   Temp 97.6 F (36.4 C)   Resp 18   SpO2 95%   Visual Acuity Right Eye Distance:   Left Eye Distance:   Bilateral Distance:    Right Eye Near:   Left Eye Near:    Bilateral Near:     Physical Exam Vitals and nursing note reviewed.  Constitutional:      General: He is not in acute distress.    Appearance: Normal appearance. He is well-developed. He is not ill-appearing.  HENT:     Mouth/Throat:     Mouth: Mucous membranes are moist.  Cardiovascular:     Rate and Rhythm: Normal rate and regular rhythm.     Heart sounds: Normal heart sounds.  Pulmonary:     Effort: Pulmonary effort is normal. No respiratory  distress.     Breath sounds: Normal breath sounds.  Musculoskeletal:        General: No swelling, tenderness, deformity or signs of injury. Normal range of motion.     Cervical back: Neck supple.  Skin:    General: Skin is warm and dry.     Capillary Refill: Capillary refill takes less than 2 seconds.     Findings: Erythema and lesion present.     Comments: Right lower thigh: 2 cm area of induration with cental 1 mm open wound with localized erythema. Ecchymosis surrounding lesion.  No drainage.    Neurological:     General: No focal deficit present.     Mental Status: He is alert and oriented to person, place, and time.     Sensory: No sensory deficit.     Motor: No weakness.  Psychiatric:        Mood and Affect: Mood normal.        Behavior: Behavior normal.      UC Treatments / Results  Labs (all labs ordered are listed, but only abnormal results are displayed) Labs Reviewed - No data to display  EKG   Radiology No results found.  Procedures Procedures (including critical care time)  Medications Ordered in UC Medications - No data to display  Initial Impression / Assessment and Plan / UC Course  I have reviewed the triage vital signs and the nursing notes.  Pertinent labs & imaging results that were available during my care of the patient were reviewed by me and considered in my medical decision making (see chart for details).   Skin lesion of right leg.  Afebrile and vital signs are stable.  The area has localized erythema.  No purulent drainage.  Treating with mupirocin ointment.  Wound care instructions discussed.  [facility].  Instructed patient to follow-up with his new PCP or return here if he notes signs of infection with his current skin lesion.  He agrees to plan of care.   Final Clinical Impressions(s) / UC Diagnoses   Final diagnoses:  Skin lesion of right leg     Discharge  Instructions  Apply the mupirocin ointment as directed.    Establish a primary care provider as soon as possible.  Keep your wound clean and dry.  Wash it gently twice a day with soap and water.  Apply the antibiotic ointment twice a day.    See your new PCP or return here if you see signs of infection, such as increased redness, pus-like drainage, warmth, fever, or other concerning symptoms.        ED Prescriptions     Medication Sig Dispense Auth. Provider   mupirocin ointment (BACTROBAN) 2 % Apply 1 Application topically 2 (two) times daily. 22 g Mickie Bail, NP      PDMP not reviewed this encounter.   Mickie Bail, NP 01/01/23 [id number]

## 2023-01-01 NOTE — ED Notes (Signed)
Scheduled for PCP 4/24

## 2023-01-01 NOTE — ED Triage Notes (Signed)
Patient presents to [facility] for evaluation of spot to right inner thigh.  Patient states its been there a little over a week, but worsening through out the week.  Patient is concerned for infection, denies known bug bite

## 2023-01-01 NOTE — Discharge Instructions (Addendum)
Apply the mupirocin ointment as directed.    Establish a primary care provider as soon as possible.  Keep your wound clean and dry.  Wash it gently twice a day with soap and water.  Apply the antibiotic ointment twice a day.    See your new PCP or return here if you see signs of infection, such as increased redness, pus-like drainage, warmth, fever, or other concerning symptoms.

## 2023-01-15 ENCOUNTER — Ambulatory Visit: Payer: BC Managed Care – PPO | Admitting: Family

## 2023-10-19 ENCOUNTER — Ambulatory Visit
Admission: EM | Admit: 2023-10-19 | Discharge: 2023-10-19 | Disposition: A | Discharge status: Home / Self Care | Payer: BC Managed Care – PPO | Attending: Emergency Medicine | Admitting: Emergency Medicine

## 2023-10-19 DIAGNOSIS — J069 Acute upper respiratory infection, unspecified: Secondary | ICD-10-CM | POA: Diagnosis not present

## 2023-10-19 DIAGNOSIS — H6692 Otitis media, unspecified, left ear: Secondary | ICD-10-CM | POA: Diagnosis not present

## 2023-10-19 MED ORDER — AMOXICILLIN-POT CLAVULANATE 875-125 MG PO TABS: 1.0000 | ORAL_TABLET | Freq: Two times a day (BID) | ORAL | 0 refills | Status: DC

## 2023-10-19 NOTE — Discharge Instructions (Addendum)
Take the Augmentin as directed.  Follow up with your primary care provider if your symptoms are not improving.

## 2023-10-19 NOTE — ED Triage Notes (Signed)
Patient to Urgent Care with complaints of left sided ear pain. Has noticed some drainage. Denies any known fevers.   Reports symptoms started Friday morning. Reports URI for over a week.

## 2023-10-19 NOTE — ED Provider Notes (Signed)
Jeffery Powers    CSN: [id number] Arrival date & time: 10/19/23  0957      History   Chief Complaint Chief Complaint  Patient presents with   Otalgia    HPI Christopherjohn Deak is a 58 y.o. male.  Patient presents with 1 week history of cough and congestion.  He developed left ear pain 2 days ago.  No fever or shortness of breath.  He took ibuprofen this morning. The history is provided by the patient and medical records.    Past Medical History:  Diagnosis Date   History of kidney stones    Kidney stones     There are no active problems to display for this patient.   Past Surgical History:  Procedure Laterality Date   EXTRACORPOREAL SHOCK WAVE LITHOTRIPSY Left 08/04/2017   Procedure: LEFT EXTRACORPOREAL SHOCK WAVE LITHOTRIPSY (ESWL);  Surgeon: Heloise Purpura, MD;  Location: [facility];  Service: Urology;  Laterality: Left;   Kidney Stone Removal     TONSILLECTOMY         Home Medications    Prior to Admission medications   Medication Sig Start Date End Date Taking? Authorizing Provider  amoxicillin-clavulanate (AUGMENTIN) 875-125 MG tablet Take 1 tablet by mouth every 12 (twelve) hours. 10/19/23  Yes Mickie Bail, NP  calcium carbonate (TUMS - DOSED IN MG ELEMENTAL CALCIUM) 500 MG chewable tablet Chew 2 tablets daily by mouth.    [provider]  HYDROcodone-acetaminophen (NORCO/VICODIN) 5-325 MG tablet Take 1 tablet by mouth every 4 (four) hours as needed. Patient not taking: Reported on 10/19/2023 04/03/17   Earley Favor, NP  mupirocin ointment (BACTROBAN) 2 % Apply 1 Application topically 2 (two) times daily. Patient not taking: Reported on 10/19/2023 01/01/23   Mickie Bail, NP  ondansetron (ZOFRAN) 4 MG tablet Take 1 tablet (4 mg total) by mouth every 6 (six) hours. 04/03/17   Earley Favor, NP  predniSONE (DELTASONE) 10 MG tablet Take 4 tablets (40 mg) for four days, then 2 tablets (20 mg) for four days, then 1 tablet (10 mg) for four days. Patient not  taking: Reported on 10/19/2023 09/28/22   Immordino, Jeannett Senior, FNP  Pseudoephedrine-Ibuprofen 30-200 MG TABS Take 1 tablet every 8 (eight) hours as needed by mouth. Patient not taking: Reported on 10/19/2023    [provider]    Family History History reviewed. No pertinent family history.  Social History Social History   Tobacco Use   Smoking status: Former    Current packs/day: 0.00    Average packs/day: 0.3 packs/day for 18.0 years (4.5 ttl pk-yrs)    Types: Cigarettes    Start date: 09/23/1988    Quit date: 09/23/2006    Years since quitting: 17.0   Smokeless tobacco: Never  Vaping Use   Vaping status: Never Used  Substance Use Topics   Alcohol use: Yes    Comment: has not had any alcohol in approx 3 wks   Drug use: No     Allergies   Patient has no known allergies.   Review of Systems Review of Systems  Constitutional:  Negative for chills and fever.  HENT:  Positive for congestion and ear pain. Negative for sore throat.   Respiratory:  Positive for cough. Negative for shortness of breath.      Physical Exam Triage Vital Signs ED Triage Vitals  Encounter Vitals Group     BP 10/19/23 1117 130/79     Systolic BP Percentile --  Diastolic BP Percentile --      Pulse Rate 10/19/23 1117 86     Resp 10/19/23 1117 18     Temp 10/19/23 1117 98.7 F (37.1 C)     Temp src --      SpO2 10/19/23 1117 95 %     Weight --      Height --      Head Circumference --      Peak Flow --      Pain Score 10/19/23 1121 2     Pain Loc --      Pain Education --      Exclude from Growth Chart --    No data found.  Updated Vital Signs BP 130/79   Pulse 86   Temp 98.7 F (37.1 C)   Resp 18   SpO2 95%   Visual Acuity Right Eye Distance:   Left Eye Distance:   Bilateral Distance:    Right Eye Near:   Left Eye Near:    Bilateral Near:     Physical Exam Constitutional:      General: He is not in acute distress. HENT:     Right Ear: Tympanic membrane  normal.     Left Ear: Tympanic membrane is erythematous.     Nose: Nose normal.     Mouth/Throat:     Mouth: Mucous membranes are moist.     Pharynx: Oropharynx is clear.  Cardiovascular:     Rate and Rhythm: Normal rate and regular rhythm.     Heart sounds: Normal heart sounds.  Pulmonary:     Effort: Pulmonary effort is normal. No respiratory distress.     Breath sounds: Normal breath sounds.  Neurological:     Mental Status: He is alert.      UC Treatments / Results  Labs (all labs ordered are listed, but only abnormal results are displayed) Labs Reviewed - No data to display  EKG   Radiology No results found.  Procedures Procedures (including critical care time)  Medications Ordered in UC Medications - No data to display  Initial Impression / Assessment and Plan / UC Course  I have reviewed the triage vital signs and the nursing notes.  Pertinent labs & imaging results that were available during my care of the patient were reviewed by me and considered in my medical decision making (see chart for details).    Left otitis media, acute URI.  Afebrile and vital signs are stable.  Treating with Augmentin for ear infection.  Tylenol or ibuprofen as needed.  Instructed him to follow-up with his PCP if he is not improving.  Education provided on otitis media and upper respiratory infection.  He agrees to plan of care.  Final Clinical Impressions(s) / UC Diagnoses   Final diagnoses:  Left otitis media, unspecified otitis media type  Acute upper respiratory infection     Discharge Instructions      Take the Augmentin as directed.  Follow-up with your primary care provider if your symptoms are not improving.      ED Prescriptions     Medication Sig Dispense Auth. Provider   amoxicillin-clavulanate (AUGMENTIN) 875-125 MG tablet Take 1 tablet by mouth every 12 (twelve) hours. 14 tablet Mickie Bail, NP      PDMP not reviewed this encounter.   Mickie Bail, NP 10/19/23 1136

## 2023-10-28 ENCOUNTER — Ambulatory Visit
Admission: EM | Admit: 2023-10-28 | Discharge: 2023-10-28 | Disposition: A | Discharge status: Home / Self Care | Payer: BC Managed Care – PPO | Attending: Emergency Medicine | Admitting: Emergency Medicine

## 2023-10-28 DIAGNOSIS — H6692 Otitis media, unspecified, left ear: Secondary | ICD-10-CM

## 2023-10-28 MED ORDER — CEFDINIR 300 MG PO CAPS: 300.0000 mg | ORAL_CAPSULE | Freq: Two times a day (BID) | ORAL | 0 refills | Status: AC

## 2023-10-28 NOTE — ED Provider Notes (Signed)
 Jeffery Powers    CSN: [id number] Arrival date & time: 10/28/23  1130      History   Chief Complaint Chief Complaint  Patient presents with   Otalgia    HPI Jeffery Powers is a 58 y.o. male.  Patient presents with ongoing intermittent left ear pain, muffled hearing, feeling of fullness.  No fever, ear drainage, sore throat, cough.  Patient was seen here on 10/19/2023; diagnosed with left otitis media and acute URI; treated with Augmentin .  His symptoms improved other than the ongoing issue with his ear.  The history is provided by the patient and medical records.    Past Medical History:  Diagnosis Date   History of kidney stones    Kidney stones     There are no active problems to display for this patient.   Past Surgical History:  Procedure Laterality Date   EXTRACORPOREAL SHOCK WAVE LITHOTRIPSY Left 08/04/2017   Procedure: LEFT EXTRACORPOREAL SHOCK WAVE LITHOTRIPSY (ESWL);  Surgeon: Renda Glance, MD;  Location: [facility];  Service: Urology;  Laterality: Left;   Kidney Stone Removal     TONSILLECTOMY         Home Medications    Prior to Admission medications   Medication Sig Start Date End Date Taking? Authorizing Provider  cefdinir  (OMNICEF ) 300 MG capsule Take 1 capsule (300 mg total) by mouth 2 (two) times daily for 10 days. 10/28/23 11/07/23 Yes Corlis Burnard DEL, NP  calcium carbonate (TUMS - DOSED IN MG ELEMENTAL CALCIUM) 500 MG chewable tablet Chew 2 tablets daily by mouth.    [provider]  HYDROcodone -acetaminophen  (NORCO/VICODIN) 5-325 MG tablet Take 1 tablet by mouth every 4 (four) hours as needed. Patient not taking: Reported on 10/19/2023 04/03/17   Terryl Kubas, NP  mupirocin  ointment (BACTROBAN ) 2 % Apply 1 Application topically 2 (two) times daily. Patient not taking: Reported on 10/19/2023 01/01/23   Corlis Burnard DEL, NP  ondansetron  (ZOFRAN ) 4 MG tablet Take 1 tablet (4 mg total) by mouth every 6 (six) hours. 04/03/17   Terryl Kubas, NP   predniSONE  (DELTASONE ) 10 MG tablet Take 4 tablets (40 mg) for four days, then 2 tablets (20 mg) for four days, then 1 tablet (10 mg) for four days. Patient not taking: Reported on 10/19/2023 09/28/22   Immordino, Garnette, FNP  Pseudoephedrine-Ibuprofen 30-200 MG TABS Take 1 tablet every 8 (eight) hours as needed by mouth. Patient not taking: Reported on 10/19/2023    [provider]    Family History History reviewed. No pertinent family history.  Social History Social History   Tobacco Use   Smoking status: Former    Current packs/day: 0.00    Average packs/day: 0.3 packs/day for 18.0 years (4.5 ttl pk-yrs)    Types: Cigarettes    Start date: 09/23/1988    Quit date: 09/23/2006    Years since quitting: 17.1   Smokeless tobacco: Never  Vaping Use   Vaping status: Never Used  Substance Use Topics   Alcohol use: Yes    Comment: has not had any alcohol in approx 3 wks   Drug use: No     Allergies   Patient has no known allergies.   Review of Systems Review of Systems  Constitutional:  Negative for chills and fever.  HENT:  Positive for ear pain. Negative for ear discharge and sore throat.   Respiratory:  Negative for cough and shortness of breath.      Physical Exam Triage Vital Signs ED Triage  Vitals  Encounter Vitals Group     BP 10/28/23 1312 128/74     Systolic BP Percentile --      Diastolic BP Percentile --      Pulse Rate 10/28/23 1312 75     Resp 10/28/23 1312 18     Temp 10/28/23 1312 98.1 F (36.7 C)     Temp src --      SpO2 10/28/23 1312 98 %     Weight --      Height --      Head Circumference --      Peak Flow --      Pain Score 10/28/23 1323 1     Pain Loc --      Pain Education --      Exclude from Growth Chart --    No data found.  Updated Vital Signs BP 128/74   Pulse 75   Temp 98.1 F (36.7 C)   Resp 18   SpO2 98%   Visual Acuity Right Eye Distance:   Left Eye Distance:   Bilateral Distance:    Right Eye Near:   Left  Eye Near:    Bilateral Near:     Physical Exam Constitutional:      General: He is not in acute distress. HENT:     Right Ear: Tympanic membrane and ear canal normal.     Left Ear: Ear canal normal. Tympanic membrane is erythematous.     Ears:     Comments: Left TM is brightly erythematous.    Nose: Nose normal.     Mouth/Throat:     Mouth: Mucous membranes are moist.     Pharynx: Oropharynx is clear.  Cardiovascular:     Rate and Rhythm: Normal rate and regular rhythm.     Heart sounds: Normal heart sounds.  Pulmonary:     Effort: Pulmonary effort is normal. No respiratory distress.     Breath sounds: Normal breath sounds.  Neurological:     Mental Status: He is alert.      UC Treatments / Results  Labs (all labs ordered are listed, but only abnormal results are displayed) Labs Reviewed - No data to display  EKG   Radiology No results found.  Procedures Procedures (including critical care time)  Medications Ordered in UC Medications - No data to display  Initial Impression / Assessment and Plan / UC Course  I have reviewed the triage vital signs and the nursing notes.  Pertinent labs & imaging results that were available during my care of the patient were reviewed by me and considered in my medical decision making (see chart for details).    Left otitis media.  Treating today with 10-day course of cefdinir .  Instructed patient to follow-up with an ENT if his symptoms are not improving.  Contact information for [facility] provided.  Education provided on otitis media.  Patient agrees to plan of care.  Final Clinical Impressions(s) / UC Diagnoses   Final diagnoses:  Left otitis media, unspecified otitis media type     Discharge Instructions      Take the cefdinir  as directed.  Follow-up with an ENT such as the 1 listed below if your symptoms are not improving.     ED Prescriptions     Medication Sig Dispense Auth. Provider   cefdinir  (OMNICEF )  300 MG capsule Take 1 capsule (300 mg total) by mouth 2 (two) times daily for 10 days. 20 capsule Corlis Burnard DEL, NP  PDMP not reviewed this encounter.   Corlis Burnard DEL, NP 10/28/23 1348

## 2023-10-28 NOTE — Discharge Instructions (Addendum)
Take the cefdinir as directed.  Follow-up with an ENT such as the 1 listed below if your symptoms are not improving.

## 2023-10-28 NOTE — ED Triage Notes (Signed)
Patient to Urgent Care with complaints of left sided ear fullness/ muffled hearing. Some intermittent, mild pain. No drainage.  Symptoms started approx 10 days ago. Completed abx Sunday.

## 2023-11-04 ENCOUNTER — Ambulatory Visit (INDEPENDENT_AMBULATORY_CARE_PROVIDER_SITE_OTHER): Payer: BC Managed Care – PPO | Admitting: Otolaryngology

## 2023-11-04 ENCOUNTER — Encounter (INDEPENDENT_AMBULATORY_CARE_PROVIDER_SITE_OTHER): Payer: Self-pay

## 2023-11-04 ENCOUNTER — Ambulatory Visit (INDEPENDENT_AMBULATORY_CARE_PROVIDER_SITE_OTHER): Payer: BC Managed Care – PPO | Admitting: Audiology

## 2023-11-04 VITALS — BP 132/86 | HR 78 | Ht 71.0 in | Wt 220.0 lb

## 2023-11-04 DIAGNOSIS — R0981 Nasal congestion: Secondary | ICD-10-CM

## 2023-11-04 DIAGNOSIS — H90A32 Mixed conductive and sensorineural hearing loss, unilateral, left ear with restricted hearing on the contralateral side: Secondary | ICD-10-CM

## 2023-11-04 DIAGNOSIS — H9041 Sensorineural hearing loss, unilateral, right ear, with unrestricted hearing on the contralateral side: Secondary | ICD-10-CM | POA: Diagnosis not present

## 2023-11-04 DIAGNOSIS — J310 Chronic rhinitis: Secondary | ICD-10-CM | POA: Diagnosis not present

## 2023-11-04 DIAGNOSIS — H9012 Conductive hearing loss, unilateral, left ear, with unrestricted hearing on the contralateral side: Secondary | ICD-10-CM

## 2023-11-04 DIAGNOSIS — J343 Hypertrophy of nasal turbinates: Secondary | ICD-10-CM | POA: Diagnosis not present

## 2023-11-04 DIAGNOSIS — H6982 Other specified disorders of Eustachian tube, left ear: Secondary | ICD-10-CM | POA: Diagnosis not present

## 2023-11-04 DIAGNOSIS — H6522 Chronic serous otitis media, left ear: Secondary | ICD-10-CM

## 2023-11-04 MED ORDER — PREDNISONE 10 MG (21) PO TBPK: ORAL_TABLET | ORAL | 0 refills | Status: AC

## 2023-11-04 MED ORDER — FLUTICASONE PROPIONATE 50 MCG/ACT NA SUSP: 2.0000 | Freq: Every day | NASAL | 6 refills | Status: AC

## 2023-11-04 NOTE — Progress Notes (Signed)
  [address] [place], [place] [address] [phone]  Audiological Evaluation    Name: Jeffery Powers     DOB:   08/16/1966      MRN:   [id number]                                                                                     Service Date: 11/04/2023     Accompanied by: unaccompanied   Patient comes today after Dr. Suszanne Conners, ENT sent a referral for a hearing evaluation due to concerns with hearing loss.   Symptoms Yes Details  Hearing loss  [x]  Left ear hearing loss since after he had an ear infection with onset 10/31/2023. Patient reports that he was treated for the ear infection at the ER.  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  Previous ear surgeries  []    Family history of hearing loss  []    Amplification  []    Other  []      Otoscopy: Right ear: Clear external ear canals and notable landmarks visualized on the tympanic membrane. Left ear:  Abnormal eardrum appearance.  Tympanometry: Right ear: Type A- Normal external ear canal volume with normal middle ear pressure and tympanic membrane compliance Left ear: Type As- Normal external ear canal volume with normal middle ear pressure and low tympanic membrane compliance   Pure tone Audiometry: Right ear- Normal hearing from 250-8000Hz , except for a moderate presumably sensorineural hearing loss notch at 6000 Hz. Left ear-  Moderate to severe mixed hearing loss from 125 Hz - 8000 Hz.  Of note, left masked bone conduction was repeated several times after it was repositioned, but results remained stable.   Speech Audiometry: Right ear- Speech Reception Threshold (SRT) was obtained at 15 dBHL. Left ear-Speech Reception Threshold (SRT) was obtained at 55 dBHL, with contralateral masking.   Word Recognition Score Tested using NU-6 (MLV) Right ear: 96% was obtained at a  presentation level of 50 dBHL without contralateral masking which is deemed as  excellent. Left ear: 92% was obtained at a presentation level of 90 dBHL with contralateral masking which is deemed as  excellent.   The hearing test results were completed under headphones and re-checked with inserts and results are deemed to be of good to fair reliability. Test technique:  conventional      Recommendations: Follow up with ENT as scheduled for today. Repeat audiogram, in conjunction with otologic care.   Tanajah Boulter MARIE LEROUX-MARTINEZ, AUD

## 2023-11-05 DIAGNOSIS — H6982 Other specified disorders of Eustachian tube, left ear: Secondary | ICD-10-CM | POA: Insufficient documentation

## 2023-11-05 DIAGNOSIS — J310 Chronic rhinitis: Secondary | ICD-10-CM | POA: Insufficient documentation

## 2023-11-05 DIAGNOSIS — H6522 Chronic serous otitis media, left ear: Secondary | ICD-10-CM | POA: Insufficient documentation

## 2023-11-05 DIAGNOSIS — J343 Hypertrophy of nasal turbinates: Secondary | ICD-10-CM | POA: Insufficient documentation

## 2023-11-05 DIAGNOSIS — H9012 Conductive hearing loss, unilateral, left ear, with unrestricted hearing on the contralateral side: Secondary | ICD-10-CM | POA: Insufficient documentation

## 2023-11-05 NOTE — Progress Notes (Signed)
Patient ID: Jeffery Powers, male   DOB: Jul 27, 1966, 58 y.o.   MRN: [id number]  CC: Left ear hearing loss, left ear pain  HPI:  Jeffery Powers is a 58 y.o. male who presents today complaining of left ear pain and left ear hearing loss for the past 2 weeks.  His symptoms started after an upper respiratory infection.  He was initially treated with Augmentin, and subsequently with cefdinir.  His left ear pain has resolved.  However, he continues to have left ear hearing difficulty.  He has a history of chronic nasal congestion.  He is unsure of any environmental allergies.  He is not on any nasal medication at this time.  Currently he denies any otalgia, otorrhea, or vertigo.  Past Medical History:  Diagnosis Date   History of kidney stones    Kidney stones     Past Surgical History:  Procedure Laterality Date   EXTRACORPOREAL SHOCK WAVE LITHOTRIPSY Left 08/04/2017   Procedure: LEFT EXTRACORPOREAL SHOCK WAVE LITHOTRIPSY (ESWL);  Surgeon: Heloise Purpura, MD;  Location: [facility];  Service: Urology;  Laterality: Left;   Kidney Stone Removal     TONSILLECTOMY      History reviewed. No pertinent family history.  Social History:  reports that he quit smoking about 17 years ago. His smoking use included cigarettes. He started smoking about 35 years ago. He has a 4.5 pack-year smoking history. He has never used smokeless tobacco. He reports current alcohol use. He reports that he does not use drugs.  Allergies: No Known Allergies  Prior to Admission medications   Medication Sig Start Date End Date Taking? Authorizing Provider  calcium carbonate (TUMS - DOSED IN MG ELEMENTAL CALCIUM) 500 MG chewable tablet Chew 2 tablets daily by mouth.   Yes [provider]  fluticasone (FLONASE) 50 MCG/ACT nasal spray Place 2 sprays into both nostrils daily. 11/04/23 12/04/23 Yes Newman Pies, MD  predniSONE (STERAPRED UNI-PAK 21 TAB) 10 MG (21) TBPK tablet Per instructions (6, 5, 4, 3, 2, 1,) 11/04/23  Yes Newman Pies, MD   cefdinir (OMNICEF) 300 MG capsule Take 1 capsule (300 mg total) by mouth 2 (two) times daily for 10 days. Patient not taking: Reported on 11/04/2023 10/28/23 11/07/23  Mickie Bail, NP  HYDROcodone-acetaminophen (NORCO/VICODIN) 5-325 MG tablet Take 1 tablet by mouth every 4 (four) hours as needed. Patient not taking: Reported on 11/04/2023 04/03/17   Earley Favor, NP  mupirocin ointment (BACTROBAN) 2 % Apply 1 Application topically 2 (two) times daily. Patient not taking: Reported on 11/04/2023 01/01/23   Mickie Bail, NP  ondansetron (ZOFRAN) 4 MG tablet Take 1 tablet (4 mg total) by mouth every 6 (six) hours. Patient not taking: Reported on 11/04/2023 04/03/17   Earley Favor, NP  predniSONE (DELTASONE) 10 MG tablet Take 4 tablets (40 mg) for four days, then 2 tablets (20 mg) for four days, then 1 tablet (10 mg) for four days. Patient not taking: Reported on 10/19/2023 09/28/22   Immordino, Jeannett Senior, FNP  Pseudoephedrine-Ibuprofen 30-200 MG TABS Take 1 tablet every 8 (eight) hours as needed by mouth. Patient not taking: Reported on 10/19/2023    [provider]    Blood pressure 132/86, pulse 78, height 5\' 11"  (1.803 m), weight 220 lb (99.8 kg), SpO2 96%. Exam: General: Communicates without difficulty, well nourished, no acute distress. Head: Normocephalic, no evidence injury, no tenderness, facial buttresses intact without stepoff. Face/sinus: No tenderness to palpation and percussion. Facial movement is normal and symmetric. Eyes: PERRL, EOMI.  No scleral icterus, conjunctivae clear. Neuro: CN II exam reveals vision grossly intact.  No nystagmus at any point of gaze. Ears: Auricles well formed without lesions.  Ear canals are intact without mass or lesion.  No erythema or edema is appreciated.  The TMs are intact with left middle ear effusion. Nose: External evaluation reveals normal support and skin without lesions.  Dorsum is intact.  Anterior rhinoscopy reveals congested mucosa over anterior  aspect of inferior turbinates and intact septum.  No purulence noted. Oral:  Oral cavity and oropharynx are intact, symmetric, without erythema or edema.  Mucosa is moist without lesions. Neck: Full range of motion without pain.  There is no significant lymphadenopathy.  No masses palpable.  Thyroid bed within normal limits to palpation.  Parotid glands and submandibular glands equal bilaterally without mass.  Trachea is midline. Neuro:  CN 2-12 grossly intact.   Procedure:  Flexible Nasal Endoscopy: Description: Risks, benefits, and alternatives of flexible endoscopy were explained to the patient.  Specific mention was made of the risk of throat numbness with difficulty swallowing, possible bleeding from the nose and mouth, and pain from the procedure.  The patient gave oral consent to proceed.  The flexible scope was inserted into the right nasal cavity.  Endoscopy of the interior nasal cavity, superior, inferior, and middle meatus was performed. The sphenoid-ethmoid recess was examined. Edematous mucosa was noted.  No polyp, mass, or lesion was appreciated. Olfactory cleft was clear.  Nasopharynx was clear.  Turbinates were hypertrophied but without mass.  The procedure was repeated on the contralateral side with similar findings.  The patient tolerated the procedure well.    His hearing test shows asymmetric left ear conductive hearing loss.  Assessment: 1.  Left ear eustachian tube dysfunction, with left middle ear effusion. 2.  Asymmetric left ear conductive hearing loss, secondary to the middle ear effusion. 3.  Chronic rhinitis with nasal mucosal congestion and bilateral inferior turbinate hypertrophy.  No obstructing nasopharyngeal mass is noted.  Plan: 1.  The physical exam and nasal endoscopy findings are reviewed with the patient. 2.  The patient should complete his current course of Ceftin. 3.  Flonase nasal spray 2 sprays each nostril daily.  The importance of consistent daily use is  discussed. 4.  Prednisone Dosepak for 6 days. 5.  The patient will return for reevaluation in 4 weeks.  Zael Shuman W Paulena Servais 11/05/2023, 8:59 AM

## 2023-12-03 ENCOUNTER — Telehealth (INDEPENDENT_AMBULATORY_CARE_PROVIDER_SITE_OTHER): Payer: Self-pay | Admitting: Otolaryngology

## 2023-12-03 NOTE — Telephone Encounter (Signed)
 Tried to call to confirm appt & location - Call answered but no response. [id number] afm

## 2023-12-04 ENCOUNTER — Encounter (INDEPENDENT_AMBULATORY_CARE_PROVIDER_SITE_OTHER): Payer: Self-pay

## 2023-12-04 ENCOUNTER — Ambulatory Visit (INDEPENDENT_AMBULATORY_CARE_PROVIDER_SITE_OTHER): Payer: BC Managed Care – PPO | Admitting: Otolaryngology

## 2023-12-04 VITALS — BP 122/80 | HR 70 | Ht 71.0 in | Wt 221.0 lb

## 2023-12-04 DIAGNOSIS — H6522 Chronic serous otitis media, left ear: Secondary | ICD-10-CM

## 2023-12-04 DIAGNOSIS — H9012 Conductive hearing loss, unilateral, left ear, with unrestricted hearing on the contralateral side: Secondary | ICD-10-CM | POA: Diagnosis not present

## 2023-12-04 DIAGNOSIS — H6982 Other specified disorders of Eustachian tube, left ear: Secondary | ICD-10-CM

## 2023-12-04 DIAGNOSIS — J343 Hypertrophy of nasal turbinates: Secondary | ICD-10-CM

## 2023-12-04 DIAGNOSIS — R0981 Nasal congestion: Secondary | ICD-10-CM

## 2023-12-04 DIAGNOSIS — J310 Chronic rhinitis: Secondary | ICD-10-CM

## 2023-12-05 NOTE — Progress Notes (Signed)
 Patient ID: Jeffery Powers, male   DOB: 1966-02-02, 58 y.o.   MRN: [id number]  Follow-up: Left middle ear effusion, left ear conductive hearing loss, chronic nasal congestion  HPI: The patient is a 58 year old male who returns today for his follow-up evaluation.  The patient was last seen 1 month ago.  At that time, he was complaining of left ear hearing loss.  He was noted to have left middle ear effusion, nasal mucosal congestion, and bilateral inferior turbinate hypertrophy.  Asymmetric left ear conductive hearing loss was noted.  The patient was treated with Flonase and prednisone Dosepak.  The patient returns today complaining of persistent left ear hearing difficulty.  He denies any otalgia or otorrhea.  His nasal congestion has slightly improved.  Exam: General: Communicates without difficulty, well nourished, no acute distress. Head: Normocephalic, no evidence injury, no tenderness, facial buttresses intact without stepoff. Face/sinus: No tenderness to palpation and percussion. Facial movement is normal and symmetric. Eyes: PERRL, EOMI. No scleral icterus, conjunctivae clear. Neuro: CN II exam reveals vision grossly intact.  No nystagmus at any point of gaze. Ears: Auricles well formed without lesions.  Ear canals are intact without mass or lesion.  No erythema or edema is appreciated.  The TMs are intact with partial left middle ear effusion. Nose: External evaluation reveals normal support and skin without lesions.  Dorsum is intact.  Anterior rhinoscopy reveals congested mucosa over anterior aspect of inferior turbinates and intact septum.  No purulence noted. Oral:  Oral cavity and oropharynx are intact, symmetric, without erythema or edema.  Mucosa is moist without lesions. Neck: Full range of motion without pain.  There is no significant lymphadenopathy.  No masses palpable.  Thyroid bed within normal limits to palpation.  Parotid glands and submandibular glands equal bilaterally without mass.   Trachea is midline. Neuro:  CN 2-12 grossly intact.   Assessment: 1.  Left ear eustachian tube dysfunction, with partial left middle ear effusion. 2.  Persistent left ear conductive hearing loss. 3.  Chronic rhinitis with nasal mucosal congestion and bilateral inferior turbinate hypertrophy.  Plan: 1.  The physical exam findings are reviewed with the patient. 2.  Continue with Flonase nasal spray 2 sprays each nostril daily. 3.  Valsalva exercise multiple times a day. 4.  The option of left myringotomy and tube placement is discussed. 5.  The patient will return for reevaluation in 1 month.

## 2024-01-19 ENCOUNTER — Ambulatory Visit (INDEPENDENT_AMBULATORY_CARE_PROVIDER_SITE_OTHER)
